# Patient Record
Sex: Male | Born: 2000 | Race: Black or African American | Hispanic: No | Marital: Single | Smoking: Never smoker
Health system: Southern US, Community
[De-identification: ages and names within clinical notes are randomized; demographics above are authoritative.]

## PROBLEM LIST (undated history)

## (undated) DIAGNOSIS — J45909 Unspecified asthma, uncomplicated: Secondary | ICD-10-CM

---
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
## 2023-03-19 ENCOUNTER — Ambulatory Visit: Payer: Self-pay

## 2023-03-19 ENCOUNTER — Ambulatory Visit

## 2023-03-19 ENCOUNTER — Ambulatory Visit
Admission: RE | Admit: 2023-03-19 | Discharge: 2023-03-19 | Disposition: A | Discharge status: Home / Self Care | Payer: Self-pay | Source: Ambulatory Visit | Attending: Family Medicine | Admitting: Family Medicine

## 2023-03-19 DIAGNOSIS — M542 Cervicalgia: Secondary | ICD-10-CM

## 2023-03-19 DIAGNOSIS — M25532 Pain in left wrist: Secondary | ICD-10-CM

## 2023-03-19 DIAGNOSIS — M79642 Pain in left hand: Secondary | ICD-10-CM | POA: Diagnosis not present

## 2023-03-19 DIAGNOSIS — M79641 Pain in right hand: Secondary | ICD-10-CM | POA: Diagnosis not present

## 2023-03-19 MED ORDER — METHOCARBAMOL 500 MG PO TABS: 500.0000 mg | ORAL_TABLET | Freq: Three times a day (TID) | ORAL | 0 refills | Status: AC | PRN
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
## 2023-03-19 NOTE — Discharge Instructions (Addendum)
 Advised patient/Mother may take Robaxin daily or as needed for neck strain.  Advised may take OTC Ibuprofen (600-800 mg) daily or as needed for bilateral hand and left wrist pain.  Encouraged increase daily water intake to 64 ounces per day while taking this medication.  Advised if symptoms worsen and/or unresolved please follow-up with your PCP or [facility].  Contact information provided with his AVS today.
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
## 2023-03-23 ENCOUNTER — Ambulatory Visit
